# Patient Record
Sex: Male | Born: 1995 | Race: White | Hispanic: No | Marital: Single | Smoking: Never smoker
Health system: Southern US, Community
[De-identification: ages and names within clinical notes are randomized; demographics above are authoritative.]

## PROBLEM LIST (undated history)

## (undated) HISTORY — PX: OTHER SURGICAL HISTORY: SHX169

---
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
## 2016-05-24 ENCOUNTER — Ambulatory Visit: Payer: Self-pay | Admitting: Psychology

## 2016-05-30 ENCOUNTER — Ambulatory Visit (INDEPENDENT_AMBULATORY_CARE_PROVIDER_SITE_OTHER): Payer: BLUE CROSS/BLUE SHIELD | Admitting: Psychology

## 2016-05-30 DIAGNOSIS — F331 Major depressive disorder, recurrent, moderate: Secondary | ICD-10-CM | POA: Diagnosis not present

## 2016-06-12 ENCOUNTER — Ambulatory Visit (INDEPENDENT_AMBULATORY_CARE_PROVIDER_SITE_OTHER): Payer: BLUE CROSS/BLUE SHIELD | Admitting: Psychology

## 2016-06-12 DIAGNOSIS — F331 Major depressive disorder, recurrent, moderate: Secondary | ICD-10-CM

## 2017-04-04 ENCOUNTER — Ambulatory Visit (INDEPENDENT_AMBULATORY_CARE_PROVIDER_SITE_OTHER): Payer: BLUE CROSS/BLUE SHIELD | Admitting: Psychology

## 2017-04-04 DIAGNOSIS — F331 Major depressive disorder, recurrent, moderate: Secondary | ICD-10-CM | POA: Diagnosis not present

## 2017-04-18 ENCOUNTER — Ambulatory Visit (INDEPENDENT_AMBULATORY_CARE_PROVIDER_SITE_OTHER): Payer: BLUE CROSS/BLUE SHIELD | Admitting: Psychology

## 2017-04-18 ENCOUNTER — Ambulatory Visit (INDEPENDENT_AMBULATORY_CARE_PROVIDER_SITE_OTHER): Payer: BLUE CROSS/BLUE SHIELD

## 2017-04-18 ENCOUNTER — Ambulatory Visit (INDEPENDENT_AMBULATORY_CARE_PROVIDER_SITE_OTHER): Payer: BLUE CROSS/BLUE SHIELD | Admitting: Urgent Care

## 2017-04-18 ENCOUNTER — Encounter: Payer: Self-pay | Admitting: Urgent Care

## 2017-04-18 VITALS — BP 122/82 | HR 69 | Temp 98.3°F | Resp 16 | Ht 75.0 in | Wt 166.4 lb

## 2017-04-18 DIAGNOSIS — F331 Major depressive disorder, recurrent, moderate: Secondary | ICD-10-CM | POA: Diagnosis not present

## 2017-04-18 DIAGNOSIS — S80811A Abrasion, right lower leg, initial encounter: Secondary | ICD-10-CM | POA: Diagnosis not present

## 2017-04-18 DIAGNOSIS — S50819A Abrasion of unspecified forearm, initial encounter: Secondary | ICD-10-CM | POA: Diagnosis not present

## 2017-04-18 DIAGNOSIS — M25561 Pain in right knee: Secondary | ICD-10-CM

## 2017-04-18 DIAGNOSIS — S30811A Abrasion of abdominal wall, initial encounter: Secondary | ICD-10-CM

## 2017-04-18 DIAGNOSIS — L089 Local infection of the skin and subcutaneous tissue, unspecified: Secondary | ICD-10-CM

## 2017-04-18 DIAGNOSIS — Z23 Encounter for immunization: Secondary | ICD-10-CM

## 2017-04-18 DIAGNOSIS — S8991XA Unspecified injury of right lower leg, initial encounter: Secondary | ICD-10-CM | POA: Diagnosis not present

## 2017-04-18 DIAGNOSIS — S80211A Abrasion, right knee, initial encounter: Secondary | ICD-10-CM | POA: Diagnosis not present

## 2017-04-18 DIAGNOSIS — W19XXXA Unspecified fall, initial encounter: Secondary | ICD-10-CM

## 2017-04-18 MED ORDER — CEPHALEXIN 500 MG PO CAPS: 500.0000 mg | ORAL_CAPSULE | Freq: Four times a day (QID) | ORAL | 0 refills | Status: DC

## 2017-04-18 NOTE — Progress Notes (Signed)
  MRN: [id number] DOB: 01-Jun-1996  Subjective:   Jerome Ruiz is a 21 y.o. male presenting for chief complaint of Knee Pain (fell x6 days ago; hurt right knee)  Reports 6 day history of worsening right knee pain, swelling s/p fall from his skateboard onto concrete. Denies fever, redness, n/v, abdominal pain. Has tried Advil with some relief of other abrasions. Reports that he has a large one on his right forearm. Also has one on his right lower torso.   Jerome Ruiz is not currently taking any medications. Also has No Known Allergies.  Jerome Ruiz denies past medical history. Also  has a past surgical history that includes Wisdom Tooth Removal .  Objective:   Vitals: BP 122/82   Pulse 69   Temp 98.3 F (36.8 C) (Oral)   Resp 16   Ht  (1.905 m)   Wt 166 lb 6.4 oz (75.5 kg)   SpO2 98%   BMI 20.80 kg/m   Physical Exam  Constitutional: He is oriented to person, place, and time. He appears well-developed and well-nourished.  Cardiovascular: Normal rate.   Pulmonary/Chest: Effort normal.  Musculoskeletal:       Right knee: He exhibits decreased range of motion (full flexion and extension), swelling, laceration (2 abrasions ~1.5cm each, one is directly over patella, another just inferior to patellar tendon) and bony tenderness (over patella). He exhibits no effusion, no ecchymosis, no deformity, no erythema, normal alignment and normal patellar mobility. Tenderness found. Patellar tendon tenderness noted.       Arms: Neurological: He is alert and oriented to person, place, and time.  Skin:       Dg Knee Complete 4 Views Right  Result Date: 04/18/2017 CLINICAL DATA:  Pain following fall EXAM: RIGHT KNEE - COMPLETE 4+ VIEW COMPARISON:  None. FINDINGS: Frontal, tunnel, lateral, and sunrise patellar images were obtained. There is no fracture or dislocation. No joint effusion. Joint spaces appear unremarkable. No erosive change. IMPRESSION: No evident fracture or joint effusion.  No  appreciable arthropathy. Electronically Signed   By: Bretta Bang III M.D.   On: 04/18/2017 15:21    Assessment and Plan :   1. Acute pain of right knee 2. Fall, initial encounter 3. Abrasion, forearm w/o infection 4. Abrasion of abdominal wall, initial encounter 5. Abrasion, right knee, initial encounter 6. Infected abrasion of right lower extremity, initial encounter - Start keflex QID for infected abrasion overlying patella. Return-to-clinic precautions discussed, patient verbalized understanding. Otherwise, f/u in 4 days with me. - Tdap vaccine greater than or equal to 7yo IM     Wallis Bamberg, PA-C [facility] at [address] [facility] [phone] 04/18/2017  3:01 PM

## 2017-04-18 NOTE — Patient Instructions (Addendum)
Abrasion An abrasion is a cut or scrape on the outer surface of your skin. An abrasion does not extend through all of the layers of your skin. It is important to care for your abrasion properly to prevent infection. What are the causes? Most abrasions are caused by falling on or gliding across the ground or another surface. When your skin rubs on something, the outer and inner layer of skin rubs off. What are the signs or symptoms? A cut or scrape is the main symptom of this condition. The scrape may be bleeding, or it may appear red or pink. If there was an associated fall, there may be an underlying bruise. How is this diagnosed? An abrasion is diagnosed with a physical exam. How is this treated? Treatment for this condition depends on how large and deep the abrasion is. Usually, your abrasion will be cleaned with water and mild soap. This removes any dirt or debris that may be stuck. An antibiotic ointment may be applied to the abrasion to help prevent infection. A bandage (dressing) may be placed on the abrasion to keep it clean. You may also need a tetanus shot. Follow these instructions at home: Medicines  Take or apply medicines only as directed by your health care provider.  If you were prescribed an antibiotic ointment, finish all of it even if you start to feel better. Wound care  Clean the wound with mild soap and water 2-3 times per day or as directed by your health care provider. Pat your wound dry with a clean towel. Do not rub it.  There are many different ways to close and cover a wound. Follow instructions from your health care provider about: ? Wound care. ? Dressing changes and removal.  Check your wound every day for signs of infection. Watch for: ? Redness, swelling, or pain. ? Fluid, blood, or pus. General instructions   Keep the dressing dry as directed by your health care provider. Do not take baths, swim, use a hot tub, or do anything that would put your wound  underwater until your health care provider approves.  If there is swelling, raise (elevate) the injured area above the level of your heart while you are sitting or lying down.  Keep all follow-up visits as directed by your health care provider. This is important. Contact a health care provider if:  You received a tetanus shot and you have swelling, severe pain, redness, or bleeding at the injection site.  Your pain is not controlled with medicine.  You have increased redness, swelling, or pain at the site of your wound. Get help right away if:  You have a red streak going away from your wound.  You have a fever.  You have fluid, blood, or pus coming from your wound.  You notice a bad smell coming from your wound or your dressing. This information is not intended to replace advice given to you by your health care provider. Make sure you discuss any questions you have with your health care provider. Document Released: 04/10/2005 Document Revised: 03/01/2016 Document Reviewed: 06/29/2014 Elsevier Interactive Patient Education  2017 Elsevier Inc.    Knee Pain, Adult Many things can cause knee pain. The pain often goes away on its own with time and rest. If the pain does not go away, tests may be done to find out what is causing the pain. Follow these instructions at home: Activity  Rest your knee.  Do not do things that cause pain.  Avoid  activities where both feet leave the ground at the same time (high-impact activities). Examples are running, jumping rope, and doing jumping jacks. General instructions  Take medicines only as told by your doctor.  Raise (elevate) your knee when you are resting. Make sure your knee is higher than your heart.  Sleep with a pillow under your knee.  If told, put ice on the knee: ? Put ice in a plastic bag. ? Place a towel between your skin and the bag. ? Leave the ice on for 20 minutes, 2-3 times a day.  Ask your doctor if you should wear  an elastic knee support.  Lose weight if you are overweight. Being overweight can make your knee hurt more.  Do not use any tobacco products. These include cigarettes, chewing tobacco, or electronic cigarettes. If you need help quitting, ask your doctor. Smoking may slow down healing. Contact a doctor if:  The pain does not stop.  The pain changes or gets worse.  You have a fever along with knee pain.  Your knee gives out or locks up.  Your knee swells, and becomes worse. Get help right away if:  Your knee feels warm.  You cannot move your knee.  You have very bad knee pain.  You have chest pain.  You have trouble breathing. Summary  Many things can cause knee pain. The pain often goes away on its own with time and rest.  Avoid activities that put stress on your knee. These include running and jumping rope.  Get help right away if you cannot move your knee, or if your knee feels warm, or if you have trouble breathing. This information is not intended to replace advice given to you by your health care provider. Make sure you discuss any questions you have with your health care provider. Document Released: 09/27/2008 Document Revised: 06/25/2016 Document Reviewed: 06/25/2016 Elsevier Interactive Patient Education  2017 [employer].     IF you received an x-ray today, you will receive an invoice from [facility]. Please contact [facility] at [phone] with questions or concerns regarding your invoice.   IF you received labwork today, you will receive an invoice from [place]. Please contact [facility] at [phone] with questions or concerns regarding your invoice.   Our billing staff will not be able to assist you with questions regarding bills from these companies.  You will be contacted with the lab results as soon as they are available. The fastest way to get your results is to activate your My Chart account. Instructions are located on the last  page of this paperwork. If you have not heard from [place] regarding the results in 2 weeks, please contact this office.

## 2017-04-22 ENCOUNTER — Encounter: Payer: Self-pay | Admitting: Urgent Care

## 2017-04-22 ENCOUNTER — Ambulatory Visit (INDEPENDENT_AMBULATORY_CARE_PROVIDER_SITE_OTHER): Payer: BLUE CROSS/BLUE SHIELD | Admitting: Urgent Care

## 2017-04-22 VITALS — BP 122/72 | HR 79 | Temp 97.8°F | Resp 17 | Ht 75.0 in | Wt 165.0 lb

## 2017-04-22 DIAGNOSIS — M25561 Pain in right knee: Secondary | ICD-10-CM

## 2017-04-22 DIAGNOSIS — L089 Local infection of the skin and subcutaneous tissue, unspecified: Secondary | ICD-10-CM | POA: Diagnosis not present

## 2017-04-22 DIAGNOSIS — S50819A Abrasion of unspecified forearm, initial encounter: Secondary | ICD-10-CM

## 2017-04-22 DIAGNOSIS — S80811D Abrasion, right lower leg, subsequent encounter: Secondary | ICD-10-CM | POA: Diagnosis not present

## 2017-04-22 DIAGNOSIS — S80211D Abrasion, right knee, subsequent encounter: Secondary | ICD-10-CM

## 2017-04-22 DIAGNOSIS — S30811D Abrasion of abdominal wall, subsequent encounter: Secondary | ICD-10-CM

## 2017-04-22 NOTE — Progress Notes (Signed)
    MRN: [id number] DOB: 1995-10-02  Subjective:   Jerome Ruiz is a 21 y.o. male presenting for follow up on infected abrasion of right knee. He started taking Keflex, is doing very well with this. Reports improvement in his knee pain, redness, swelling. He is not taking anything for inflammation.   Jerome Ruiz has a current medication list which includes the following prescription(s): cephalexin. Also has No Known Allergies.  Jerome Ruiz denies past medical history. Also  has a past surgical history that includes Wisdom Tooth Removal .  Objective:   Vitals: BP 122/72   Pulse 79   Temp 97.8 F (36.6 C) (Oral)   Resp 17   Ht  (1.905 m)   Wt 165 lb (74.8 kg)   SpO2 98%   BMI 20.62 kg/m   Physical Exam  Constitutional: He is oriented to person, place, and time. He appears well-developed and well-nourished.  Cardiovascular: Normal rate.   Pulmonary/Chest: Effort normal.  Musculoskeletal:       Right knee: He exhibits swelling (trace). He exhibits normal range of motion, no effusion, no ecchymosis, no deformity, no laceration, no erythema, normal alignment and normal patellar mobility. No tenderness found.       Legs: Neurological: He is alert and oriented to person, place, and time.  Skin:      Assessment and Plan :   1. Acute pain of right knee 2. Abrasion, forearm w/o infection 3. Abrasion of abdominal wall, subsequent encounter 4. Abrasion, right knee, subsequent encounter 5. Infected abrasion of right lower extremity, subsequent encounter - Significant improvement. Maintain Keflex. Add APAP with ibuprofen. Follow up if symptoms fail to resolve.  Wallis Bamberg, PA-C [facility] [phone] 04/22/2017 10:54 AM

## 2017-04-22 NOTE — Patient Instructions (Addendum)
You may take  Tylenol with ibuprofen  every 6 hours with food for pain and inflammation.     Abrasion An abrasion is a cut or scrape on the outer surface of your skin. An abrasion does not extend through all of the layers of your skin. It is important to care for your abrasion properly to prevent infection. What are the causes? Most abrasions are caused by falling on or gliding across the ground or another surface. When your skin rubs on something, the outer and inner layer of skin rubs off. What are the signs or symptoms? A cut or scrape is the main symptom of this condition. The scrape may be bleeding, or it may appear red or pink. If there was an associated fall, there may be an underlying bruise. How is this diagnosed? An abrasion is diagnosed with a physical exam. How is this treated? Treatment for this condition depends on how large and deep the abrasion is. Usually, your abrasion will be cleaned with water and mild soap. This removes any dirt or debris that may be stuck. An antibiotic ointment may be applied to the abrasion to help prevent infection. A bandage (dressing) may be placed on the abrasion to keep it clean. You may also need a tetanus shot. Follow these instructions at home: Medicines  Take or apply medicines only as directed by your health care provider.  If you were prescribed an antibiotic ointment, finish all of it even if you start to feel better. Wound care  Clean the wound with mild soap and water 2-3 times per day or as directed by your health care provider. Pat your wound dry with a clean towel. Do not rub it.  There are many different ways to close and cover a wound. Follow instructions from your health care provider about: ? Wound care. ? Dressing changes and removal.  Check your wound every day for signs of infection. Watch for: ? Redness, swelling, or pain. ? Fluid, blood, or pus. General instructions   Keep the dressing dry as directed by  your health care provider. Do not take baths, swim, use a hot tub, or do anything that would put your wound underwater until your health care provider approves.  If there is swelling, raise (elevate) the injured area above the level of your heart while you are sitting or lying down.  Keep all follow-up visits as directed by your health care provider. This is important. Contact a health care provider if:  You received a tetanus shot and you have swelling, severe pain, redness, or bleeding at the injection site.  Your pain is not controlled with medicine.  You have increased redness, swelling, or pain at the site of your wound. Get help right away if:  You have a red streak going away from your wound.  You have a fever.  You have fluid, blood, or pus coming from your wound.  You notice a bad smell coming from your wound or your dressing. This information is not intended to replace advice given to you by your health care provider. Make sure you discuss any questions you have with your health care provider. Document Released: 04/10/2005 Document Revised: 03/01/2016 Document Reviewed: 06/29/2014 Elsevier Interactive Patient Education  2017 [employer].    IF you received an x-ray today, you will receive an invoice from [facility]. Please contact [facility] at [phone] with questions or concerns regarding your invoice.   IF you received labwork today, you will receive an invoice  from [place]. Please contact [facility] at [phone] with questions or concerns regarding your invoice.   Our billing staff will not be able to assist you with questions regarding bills from these companies.  You will be contacted with the lab results as soon as they are available. The fastest way to get your results is to activate your My Chart account. Instructions are located on the last page of this paperwork. If you have not heard from [place] regarding the results in 2 weeks, please  contact this office.

## 2017-05-01 ENCOUNTER — Ambulatory Visit (INDEPENDENT_AMBULATORY_CARE_PROVIDER_SITE_OTHER): Payer: BLUE CROSS/BLUE SHIELD | Admitting: Family Medicine

## 2017-05-01 ENCOUNTER — Encounter: Payer: Self-pay | Admitting: Family Medicine

## 2017-05-01 VITALS — BP 112/62 | HR 103 | Temp 98.3°F | Resp 17 | Ht 75.0 in | Wt 169.6 lb

## 2017-05-01 DIAGNOSIS — M25561 Pain in right knee: Secondary | ICD-10-CM

## 2017-05-01 NOTE — Patient Instructions (Addendum)
     IF you received an x-ray today, you will receive an invoice from [facility]. Please contact [facility] at [phone] with questions or concerns regarding your invoice.   IF you received labwork today, you will receive an invoice from [facility]. Please contact [facility] at [phone] with questions or concerns regarding your invoice.   Our billing staff will not be able to assist you with questions regarding bills from these companies.  You will be contacted with the lab results as soon as they are available. The fastest way to get your results is to activate your My Chart account. Instructions are located on the last page of this paperwork. If you have not heard from us regarding the results in 2 weeks, please contact this office.      Knee Pain, Adult Many things can cause knee pain. The pain often goes away on its own with time and rest. If the pain does not go away, tests may be done to find out what is causing the pain. Follow these instructions at home: Activity  Rest your knee.  Do not do things that cause pain.  Avoid activities where both feet leave the ground at the same time (high-impact activities). Examples are running, jumping rope, and doing jumping jacks. General instructions  Take medicines only as told by your doctor.  Raise (elevate) your knee when you are resting. Make sure your knee is higher than your heart.  Sleep with a pillow under your knee.  If told, put ice on the knee: ? Put ice in a plastic bag. ? Place a towel between your skin and the bag. ? Leave the ice on for 20 minutes, 2-3 times a day.  Ask your doctor if you should wear an elastic knee support.  Lose weight if you are overweight. Being overweight can make your knee hurt more.  Do not use any tobacco products. These include cigarettes, chewing tobacco, or electronic cigarettes. If you need help quitting, ask your doctor. Smoking may slow down healing. Contact a  doctor if:  The pain does not stop.  The pain changes or gets worse.  You have a fever along with knee pain.  Your knee gives out or locks up.  Your knee swells, and becomes worse. Get help right away if:  Your knee feels warm.  You cannot move your knee.  You have very bad knee pain.  You have chest pain.  You have trouble breathing. Summary  Many things can cause knee pain. The pain often goes away on its own with time and rest.  Avoid activities that put stress on your knee. These include running and jumping rope.  Get help right away if you cannot move your knee, or if your knee feels warm, or if you have trouble breathing. This information is not intended to replace advice given to you by your health care provider. Make sure you discuss any questions you have with your health care provider. Document Released: 09/27/2008 Document Revised: 06/25/2016 Document Reviewed: 06/25/2016 Elsevier Interactive Patient Education  2017 [employer]  

## 2017-05-01 NOTE — Progress Notes (Signed)
  Chief Complaint  Patient presents with  . right knee pain    fell off of skateboard, seen on 04/12/17 at 89 for pain and was given cephalexin and it help and was told to return to office if he continued to have pain.  Pain when he presses on it but can walk on it w/o pain    HPI   Initial injury 04/12/2017 With follow up on 04/18/2017 and 04/22/17 at PCP for infected knee due to right abrasion He now is walking well Has some pain with kneeling He completed keflex No fevers or chills He denies redness or effusion  No past medical history on file.  Current Outpatient Prescriptions  Medication Sig Dispense Refill  . cephALEXin (KEFLEX) 500 MG capsule Take 1 capsule (500 mg total) by mouth 4 (four) times daily. (Patient not taking: Reported on 05/01/2017) 40 capsule 0   No current facility-administered medications for this visit.     Allergies: No Known Allergies  Past Surgical History:  Procedure Laterality Date  . Wisdom Tooth Removal       Social History   Social History  . Marital status: Single    Spouse name: N/A  . Number of children: N/A  . Years of education: N/A   Social History Main Topics  . Smoking status: Never Smoker  . Smokeless tobacco: Never Used  . Alcohol use No  . Drug use: No  . Sexual activity: Not Asked   Other Topics Concern  . None   Social History Narrative  . None    Review of Systems  Constitutional: Negative for chills and fever.  Cardiovascular: Negative for leg swelling.  Musculoskeletal: Positive for joint pain. Negative for back pain, falls and myalgias.  Skin: Negative for itching and rash.  Neurological: Negative for focal weakness and loss of consciousness.     Objective: Vitals:   05/01/17 1445  BP: 112/62  Pulse: (!) 103  Resp: 17  Temp: 98.3 F (36.8 C)  TempSrc: Oral  SpO2: 94%  Weight: 169 lb 9.6 oz (76.9 kg)  Height: 6\' 3"  (1.905 m)    Physical Exam  Constitutional: He appears well-developed and  well-nourished.  HENT:  Head: Normocephalic and atraumatic.  Pulmonary/Chest: Effort normal.  Musculoskeletal:  Right knee with only mild effusion at the right lateral border Patellar tendon normal. Apprehension test negative. No erythema. Faint clicking sensation with range of motion but otherwise able to perform full range of motion. No warmth. No fluctuance or induration     Assessment and Plan Henrick was seen today for right knee pain.  Diagnoses and all orders for this visit:  Acute pain of right knee- discussed avoidance of high impact activities Continue ice and ibuprofen once a day at least Discussed that although the acute incident has improved long term there might be continued inflammation so antiinflammatory doses of nsaid and ice will be important      Lillis Nuttle A [employer]

## 2017-05-12 ENCOUNTER — Ambulatory Visit: Payer: BLUE CROSS/BLUE SHIELD | Admitting: Psychology

## 2017-05-21 ENCOUNTER — Ambulatory Visit (INDEPENDENT_AMBULATORY_CARE_PROVIDER_SITE_OTHER): Payer: BLUE CROSS/BLUE SHIELD | Admitting: Psychology

## 2017-05-21 DIAGNOSIS — F331 Major depressive disorder, recurrent, moderate: Secondary | ICD-10-CM

## 2017-06-16 ENCOUNTER — Ambulatory Visit: Payer: Self-pay | Admitting: Psychology

## 2017-08-11 ENCOUNTER — Ambulatory Visit (HOSPITAL_COMMUNITY): Payer: BLUE CROSS/BLUE SHIELD | Admitting: Psychiatry

## 2019-06-29 IMAGING — DX DG KNEE COMPLETE 4+V*R*
4 series · 4 of 4 positions shown · non-contrast
Comparison: None.

CLINICAL DATA: Pain following fall

EXAM:
RIGHT KNEE - COMPLETE 4+ VIEW

[knee ap]
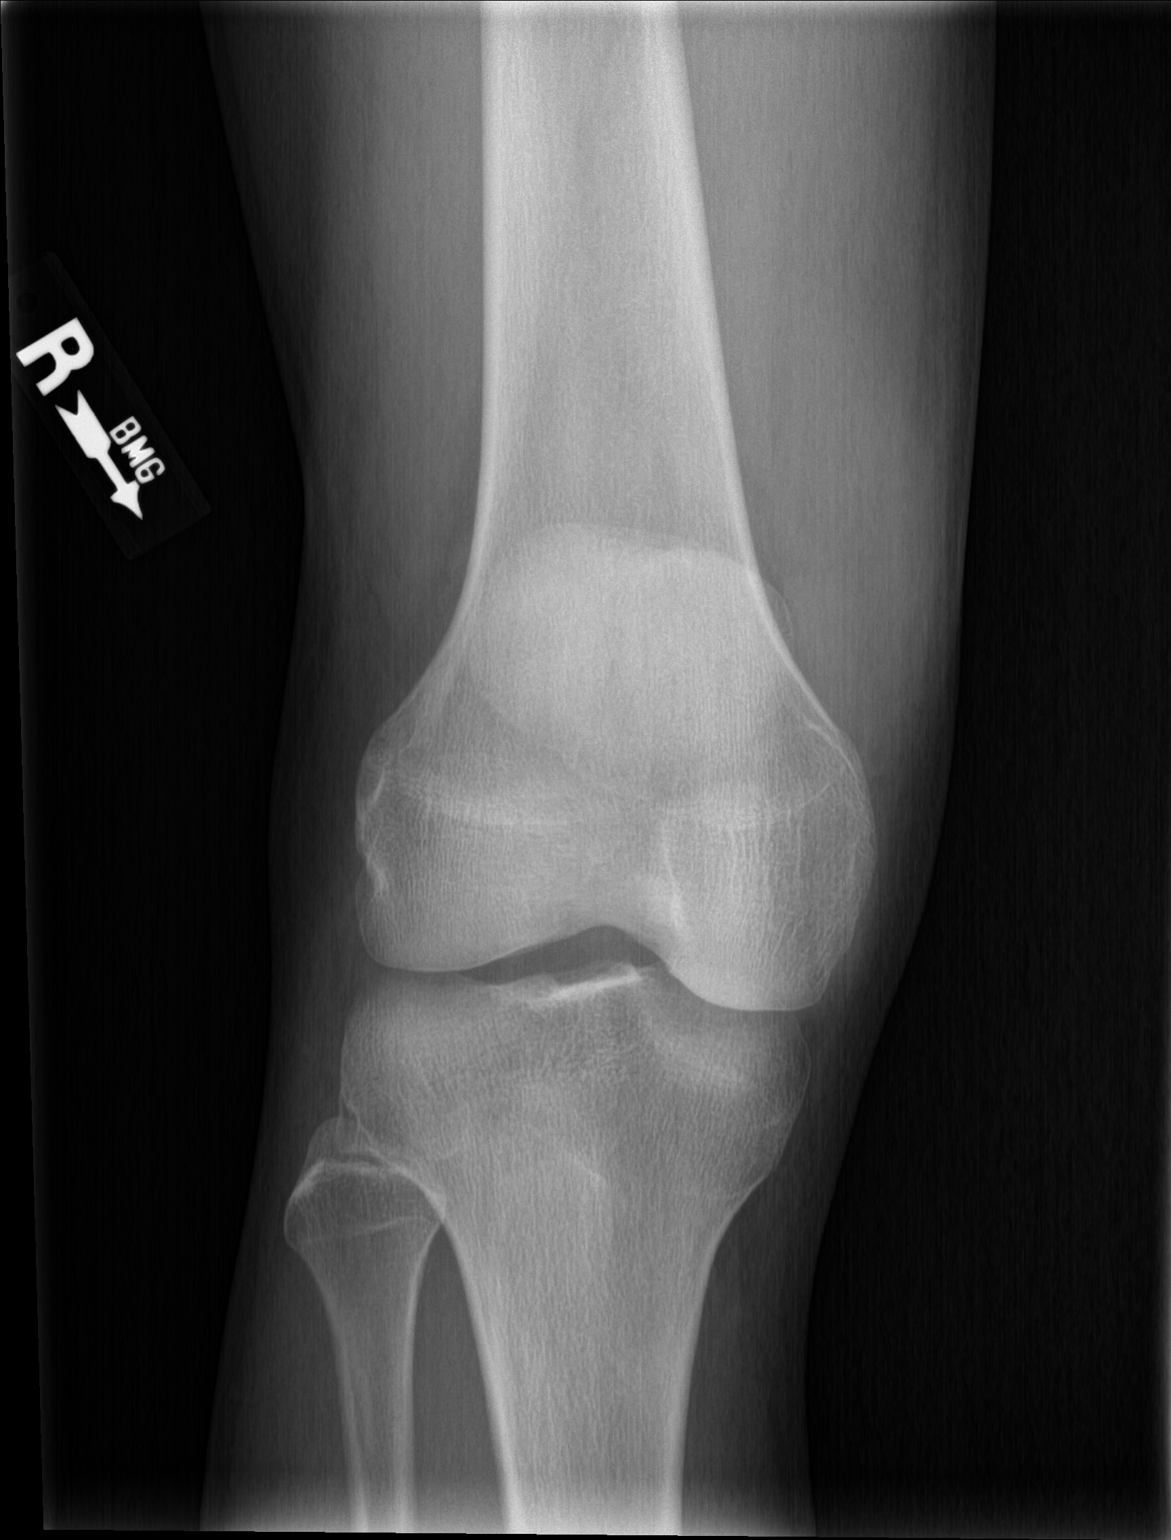

[knee lat]
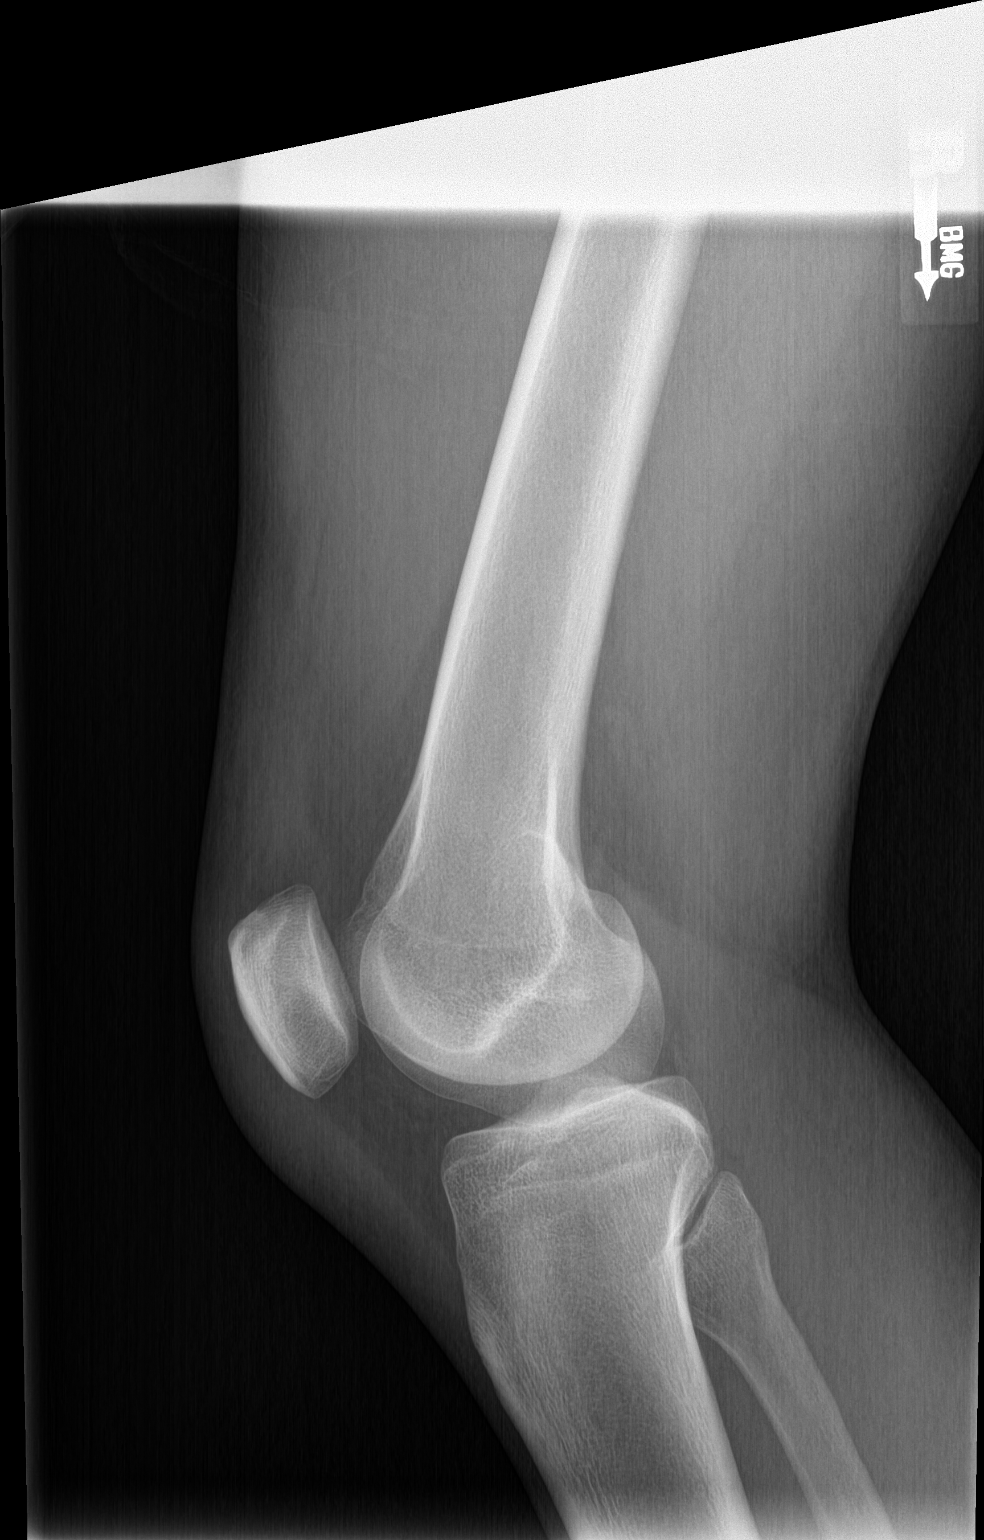

[sunrise]
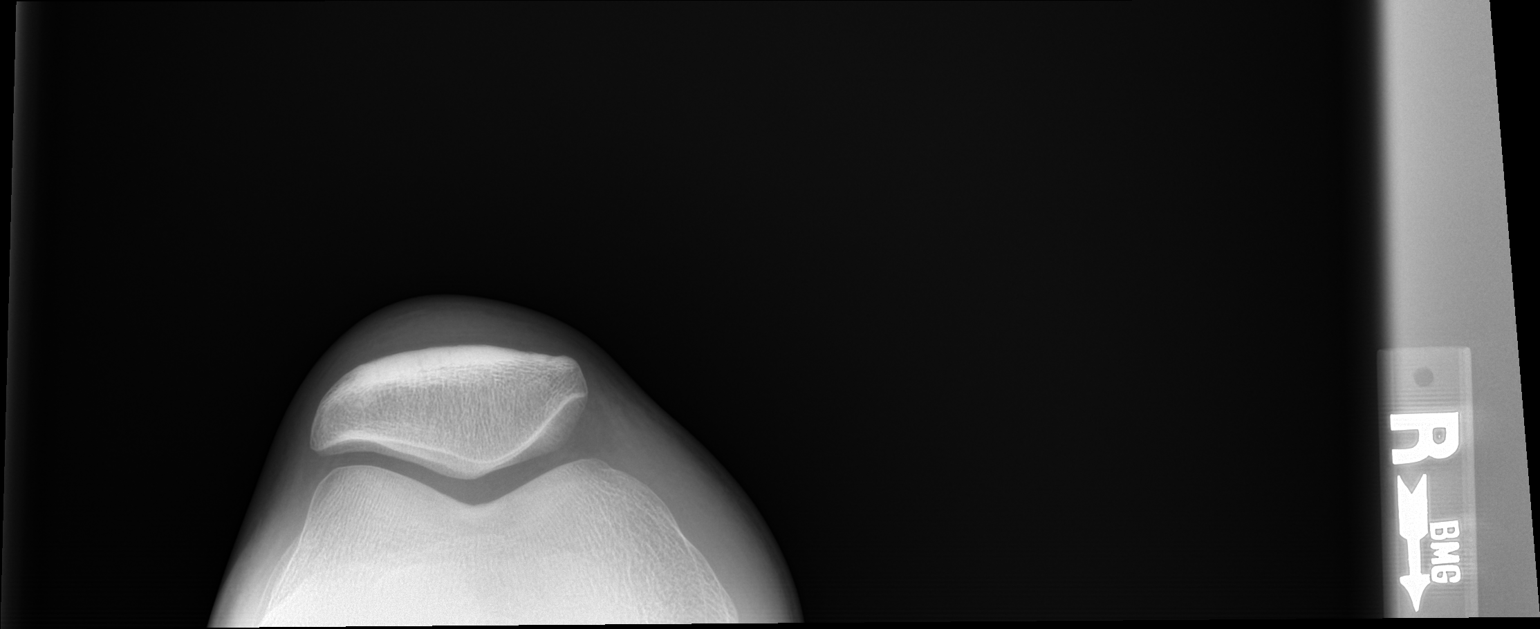

[knee [person_name]]
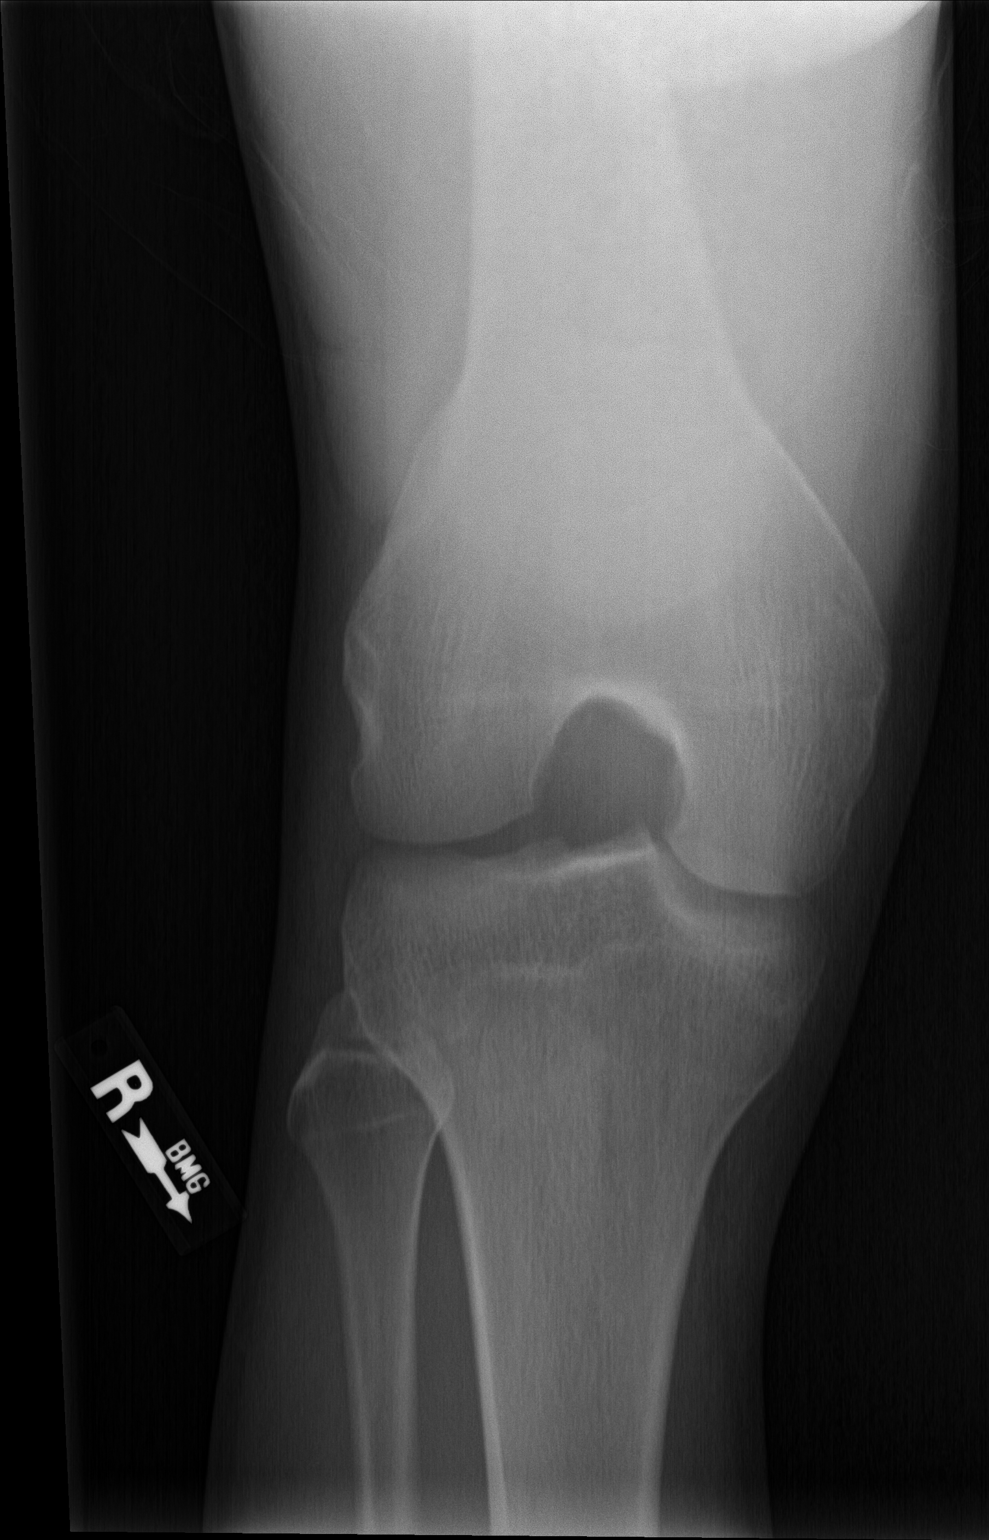

[4 of 4 positions shown; findings below may reference images not displayed]

FINDINGS: Frontal, tunnel, lateral, and sunrise patellar images were obtained.
There is no fracture or dislocation. No joint effusion. Joint spaces
appear unremarkable. No erosive change.
IMPRESSION: No evident fracture or joint effusion.  No appreciable arthropathy.

## 2019-10-15 ENCOUNTER — Other Ambulatory Visit: Payer: Self-pay

## 2019-10-15 ENCOUNTER — Encounter (HOSPITAL_COMMUNITY): Payer: Self-pay

## 2019-10-15 ENCOUNTER — Ambulatory Visit (HOSPITAL_COMMUNITY)
Admission: EM | Admit: 2019-10-15 | Discharge: 2019-10-15 | Disposition: A | Discharge status: Home / Self Care | Payer: BC Managed Care – PPO | Attending: Family Medicine | Admitting: Family Medicine

## 2019-10-15 DIAGNOSIS — M7989 Other specified soft tissue disorders: Secondary | ICD-10-CM

## 2019-10-15 MED ORDER — IBUPROFEN 600 MG PO TABS: 600.0000 mg | ORAL_TABLET | Freq: Four times a day (QID) | ORAL | 0 refills | Status: AC | PRN

## 2019-10-15 NOTE — ED Triage Notes (Signed)
Pt states he noticed today that he has a large lump under his arm. Pt states the lump is painful when he lifts his arm.

## 2019-10-15 NOTE — ED Provider Notes (Addendum)
[facility]    CSN: [id number] Arrival date & time: 10/15/19  1351      History   Chief Complaint Chief Complaint  Patient presents with  . lump under arm    HPI Jerome Ruiz is a 24 y.o. male no significant past medical history presenting today for evaluation of left arm swelling.  Patient notes that yesterday he felt some discomfort in his left armpit with raising his arms.  Today he noticed a lump in this area.  He does report that he recently received his second Covid vaccine proximately 3 days ago in the same arm.  He denies history of abscesses.  Denies fevers.  HPI  History reviewed. No pertinent past medical history.  There are no problems to display for this patient.   Past Surgical History:  Procedure Laterality Date  . Wisdom Tooth Removal          Home Medications    Prior to Admission medications   Medication Sig Start Date End Date Taking? Authorizing Provider  ibuprofen (ADVIL) 600 MG tablet Take 1 tablet (600 mg total) by mouth every 6 (six) hours as needed. 10/15/19   Isrrael Fluckiger, Junius Creamer, PA-C    Family History History reviewed. No pertinent family history.  Social History Social History   Tobacco Use  . Smoking status: Never Smoker  . Smokeless tobacco: Never Used  Substance Use Topics  . Alcohol use: Yes    Comment: occ  . Drug use: No     Allergies   Patient has no known allergies.   Review of Systems Review of Systems  Constitutional: Negative for fatigue and fever.  Eyes: Negative for redness, itching and visual disturbance.  Respiratory: Negative for shortness of breath.   Cardiovascular: Negative for chest pain and leg swelling.  Gastrointestinal: Negative for nausea and vomiting.  Musculoskeletal: Negative for arthralgias and myalgias.  Skin: Negative for color change, rash and wound.       swelling  Neurological: Negative for dizziness, syncope, weakness, light-headedness and headaches.     Physical  Exam Triage Vital Signs ED Triage Vitals  Enc Vitals Group     BP 10/15/19 1420 118/64     Pulse Rate 10/15/19 1420 79     Resp 10/15/19 1420 18     Temp 10/15/19 1420 98.4 F (36.9 C)     Temp Source 10/15/19 1420 Oral     SpO2 10/15/19 1420 98 %     Weight 10/15/19 1421 209 lb (94.8 kg)     Height 10/15/19 1421 6\' 3"  (1.905 m)     Head Circumference --      Peak Flow --      Pain Score 10/15/19 1421 3     Pain Loc --      Pain Edu? --      Excl. in GC? --    No data found.  Updated Vital Signs BP 118/64   Pulse 79   Temp 98.4 F (36.9 C) (Oral)   Resp 18   Ht 6\' 3"  (1.905 m)   Wt 209 lb (94.8 kg)   SpO2 98%   BMI 26.12 kg/m   Visual Acuity Right Eye Distance:   Left Eye Distance:   Bilateral Distance:    Right Eye Near:   Left Eye Near:    Bilateral Near:     Physical Exam Vitals and nursing note reviewed.  Constitutional:      Appearance: He is well-developed.  Comments: No acute distress  HENT:     Head: Normocephalic and atraumatic.     Nose: Nose normal.  Eyes:     Conjunctiva/sclera: Conjunctivae normal.  Cardiovascular:     Rate and Rhythm: Normal rate and regular rhythm.  Pulmonary:     Effort: Pulmonary effort is normal. No respiratory distress.     Comments: Breathing comfortably at rest, CTABL, no wheezing, rales or other adventitious sounds auscultated Abdominal:     General: There is no distension.  Musculoskeletal:        General: Normal range of motion.     Cervical back: Neck supple.     Comments: Full active range of motion of bilateral shoulders  Skin:    General: Skin is warm and dry.     Comments: Left proximal chest and mid clavicular line with slight soft swelling compared to right side, no palpable lymphadenopathy in left axillary area, no overlying erythema, no palpable induration or fluctuance.  Mildly tender to palpation  Neurological:     Mental Status: He is alert and oriented to person, place, and time.      UC  Treatments / Results  Labs (all labs ordered are listed, but only abnormal results are displayed) Labs Reviewed - No data to display  EKG   Radiology No results found.  Procedures Procedures (including critical care time)  Medications Ordered in UC Medications - No data to display  Initial Impression / Assessment and Plan / UC Course  I have reviewed the triage vital signs and the nursing notes.  Pertinent labs & imaging results that were available during my care of the patient were reviewed by me and considered in my medical decision making (see chart for details).     Acute swelling localized to left axillary area on trunk, no palpable lymphadenopathy, exam not suggestive of abscess, not suggestive of lipoma.  Unclear cause at this time, questionable correlation with vaccine.  Recommending warm compresses, anti-inflammatories with monitoring for gradual relief solution.  If symptoms persisting/worsening recommended follow-up with PCP for possible ultrasound of this area. No palpable abnormality of this area.  Lungs clear.  Follow-up if developing more suggestive signs of infection/abscess, possible early presentation.   Discussed strict return precautions. Patient verbalized understanding and is agreeable with plan.  Final Clinical Impressions(s) / UC Diagnoses   Final diagnoses:  Left axillary swelling     Discharge Instructions     Use anti-inflammatories for pain/swelling. You may take up to 600 mg Ibuprofen every 8 hours with food. You may supplement Ibuprofen with Tylenol [phone] mg every 8 hours.  Warm compresses to area- hot rag or heating pad  Follow up if developing increased pain swelling or redness to this area, if symptoms not resolving with time please follow-up with primary care to consider possible ultrasound of this area    ED Prescriptions    Medication Sig Dispense Auth. Provider   ibuprofen (ADVIL) 600 MG tablet Take 1 tablet (600 mg total) by mouth  every 6 (six) hours as needed. 30 tablet Maddoxx Burkitt, [place] C, PA-C     PDMP not reviewed this encounter.   Daryl Beehler, [place] C, PA-C 10/15/19 1507    Janith Lima, PA-C 10/15/19 1508

## 2019-10-15 NOTE — Discharge Instructions (Signed)
Use anti-inflammatories for pain/swelling. You may take up to 600 mg Ibuprofen every 8 hours with food. You may supplement Ibuprofen with Tylenol [phone] mg every 8 hours.  Warm compresses to area- hot rag or heating pad  Follow up if developing increased pain swelling or redness to this area, if symptoms not resolving with time please follow-up with primary care to consider possible ultrasound of this area
